# Patient Record
Sex: Male | Born: 1975 | Race: White | Hispanic: No | Marital: Married | Smoking: Never smoker
Health system: Southern US, Community
[De-identification: ages and names within clinical notes are randomized; demographics above are authoritative.]

## PROBLEM LIST (undated history)

## (undated) DIAGNOSIS — D66 Hereditary factor VIII deficiency: Secondary | ICD-10-CM

## (undated) HISTORY — PX: NO PAST SURGERIES: SHX2092

---

## 2009-09-17 ENCOUNTER — Emergency Department: Payer: Self-pay | Admitting: Emergency Medicine

## 2009-09-18 ENCOUNTER — Emergency Department: Payer: Self-pay | Admitting: Emergency Medicine

## 2010-05-31 ENCOUNTER — Emergency Department: Payer: Self-pay | Admitting: Emergency Medicine

## 2010-09-22 ENCOUNTER — Observation Stay (HOSPITAL_COMMUNITY)
Admission: EM | Admit: 2010-09-22 | Discharge: 2010-09-23 | Disposition: A | Discharge status: Home / Self Care | Payer: BC Managed Care – PPO | Source: Ambulatory Visit | Attending: Internal Medicine | Admitting: Internal Medicine

## 2010-09-22 DIAGNOSIS — D66 Hereditary factor VIII deficiency: Secondary | ICD-10-CM | POA: Insufficient documentation

## 2010-09-22 DIAGNOSIS — R55 Syncope and collapse: Principal | ICD-10-CM | POA: Insufficient documentation

## 2010-09-22 DIAGNOSIS — Y9239 Other specified sports and athletic area as the place of occurrence of the external cause: Secondary | ICD-10-CM | POA: Insufficient documentation

## 2010-09-22 DIAGNOSIS — W010XXA Fall on same level from slipping, tripping and stumbling without subsequent striking against object, initial encounter: Secondary | ICD-10-CM | POA: Insufficient documentation

## 2010-09-22 DIAGNOSIS — S0003XA Contusion of scalp, initial encounter: Secondary | ICD-10-CM | POA: Insufficient documentation

## 2010-09-23 ENCOUNTER — Emergency Department (HOSPITAL_COMMUNITY): Payer: BC Managed Care – PPO

## 2010-09-23 LAB — BASIC METABOLIC PANEL
BUN: 11 mg/dL (ref 6–23)
CO2: 25 mEq/L (ref 19–32)
GFR calc non Af Amer: >60 mL/min (ref >60)
Glucose, Bld: 95 mg/dL (ref 70–99)
Potassium: 3.8 mEq/L (ref 3.5–5.1)

## 2010-09-23 LAB — CBC
MCH: 31.1 pg (ref 26.0–34.0)
MCV: 87 fL (ref 78.0–100.0)
Platelets: 162 10*3/uL (ref 150–400)
RBC: 4.24 MIL/uL (ref 4.22–5.81)

## 2010-09-23 LAB — DIFFERENTIAL
Eosinophils Absolute: 0.1 10*3/uL (ref 0.0–0.7)
Eosinophils Relative: 1 % (ref 0–5)
Lymphs Abs: 1.9 10*3/uL (ref 0.7–4.0)
Monocytes Relative: 5 % (ref 3–12)
Neutrophils Relative %: 64 % (ref 43–77)
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
## 2014-05-23 ENCOUNTER — Ambulatory Visit: Payer: Self-pay | Admitting: Family Medicine

## 2017-08-22 ENCOUNTER — Other Ambulatory Visit: Payer: Self-pay

## 2017-08-22 ENCOUNTER — Ambulatory Visit
Admission: EM | Admit: 2017-08-22 | Discharge: 2017-08-22 | Disposition: A | Discharge status: Home / Self Care | Payer: 59 | Attending: Family Medicine | Admitting: Family Medicine

## 2017-08-22 DIAGNOSIS — J0100 Acute maxillary sinusitis, unspecified: Secondary | ICD-10-CM | POA: Diagnosis not present

## 2017-08-22 HISTORY — DX: Hereditary factor VIII deficiency: D66

## 2017-08-22 MED ORDER — AMOXICILLIN-POT CLAVULANATE 875-125 MG PO TABS: 1.0000 | ORAL_TABLET | Freq: Two times a day (BID) | ORAL | 0 refills | Status: AC

## 2017-08-22 NOTE — ED Provider Notes (Signed)
[facility]    CSN: [id number] Arrival date & time: 08/22/17  1350  History   Chief Complaint Chief Complaint  Patient presents with  . Facial Pain    APPT   HPI 42 year old male presents with concerns for sinusitis.  Patient reports a one-week history of nasal congestion, runny nose, sinus pain and pressure.  He reports green nasal discharge.  Associated headache.  No fever.  Has been using Tylenol and NyQuil without improvement.  No known exacerbating factors.  No reported sick contacts.  His pain is mild in severity.  No other associated symptoms.  No other complaints at this time.  Past Medical History:  Diagnosis Date  . Hemophilia [facility](HCC)    Past Surgical History:  Procedure Laterality Date  . NO PAST SURGERIES      Home Medications    Prior to Admission medications   Medication Sig Start Date End Date Taking? Authorizing Provider  amoxicillin-clavulanate (AUGMENTIN) 875-125 MG tablet Take 1 tablet by mouth every 12 (twelve) hours. 08/22/17   Tommie Samsook, Teiana Hajduk G, DO    Family History Family History  Problem Relation Age of Onset  . CVA Father     Social History Social History   Tobacco Use  . Smoking status: Never Smoker  . Smokeless tobacco: Never Used  Substance Use Topics  . Alcohol use: No    Frequency: Never  . Drug use: No     Allergies   Patient has no known allergies.   Review of Systems Review of Systems  Constitutional: Negative for chills and fever.  HENT: Positive for congestion, rhinorrhea, sinus pressure and sinus pain.    Physical Exam Triage Vital Signs ED Triage Vitals  Enc Vitals Group     BP 08/22/17 1417 124/83     Pulse Rate 08/22/17 1417 (!) 52     Resp 08/22/17 1417 18     Temp 08/22/17 1417 98.6 F (37 C)     Temp Source 08/22/17 1417 Oral     SpO2 08/22/17 1417 99 %     Weight 08/22/17 1415 194 lb (88 kg)     Height 08/22/17 1415 5\' 10"  (1.778 m)     Head Circumference --      Peak Flow --      Pain Score  08/22/17 1415 2     Pain Loc --      Pain Edu? --      Excl. in GC? --    Updated Vital Signs BP 124/83 (BP Location: Left Arm)   Pulse (!) 52   Temp 98.6 F (37 C) (Oral)   Resp 18   Ht 5\' 10"  (1.778 m)   Wt 194 lb (88 kg)   SpO2 99%   BMI 27.84 kg/m    Physical Exam  Constitutional: He is oriented to person, place, and time. He appears well-developed. No distress.  HENT:  Head: Normocephalic and atraumatic.  Nose: Nose normal.  Oropharynx clear.  Mild maxillary sinus tenderness palpation.  Eyes: Conjunctivae are normal. Right eye exhibits no discharge. Left eye exhibits no discharge.  Neck: Neck supple.  Cardiovascular: Normal rate and regular rhythm.  Pulmonary/Chest: Effort normal and breath sounds normal.  Lymphadenopathy:    He has no cervical adenopathy.  Neurological: He is alert and oriented to person, place, and time.  Psychiatric: He has a normal mood and affect. His behavior is normal.  Nursing note and vitals reviewed.  UC Treatments / Results  Labs (all labs ordered  are listed, but only abnormal results are displayed) Labs Reviewed - No data to display  EKG  EKG Interpretation None       Radiology No results found.  Procedures Procedures (including critical care time)  Medications Ordered in UC Medications - No data to display   Initial Impression / Assessment and Plan / UC Course  I have reviewed the triage vital signs and the nursing notes.  Pertinent labs & imaging results that were available during my care of the patient were reviewed by me and considered in my medical decision making (see chart for details).    42 year old male presents with sinusitis.  Treating with Augmentin.  Final Clinical Impressions(s) / UC Diagnoses   Final diagnoses:  Acute maxillary sinusitis, recurrence not specified    ED Discharge Orders        Ordered    amoxicillin-clavulanate (AUGMENTIN) 875-125 MG tablet  Every 12 hours     08/22/17 1445      Controlled Substance Prescriptions [place] Controlled Substance Registry consulted? Not Applicable   Tommie Sams, DO 08/22/17 1455

## 2017-08-22 NOTE — Discharge Instructions (Signed)
Antibiotic as prescribed.  Take care  Dr. Claudett Bayly  

## 2017-08-22 NOTE — ED Triage Notes (Signed)
Patient complains of sinus pain and pressure, runny nose, cough x 1 week.

## 2018-06-12 ENCOUNTER — Ambulatory Visit
Admission: EM | Admit: 2018-06-12 | Discharge: 2018-06-12 | Disposition: A | Discharge status: Home / Self Care | Payer: 59 | Attending: Family Medicine | Admitting: Family Medicine

## 2018-06-12 ENCOUNTER — Other Ambulatory Visit: Payer: Self-pay

## 2018-06-12 DIAGNOSIS — J069 Acute upper respiratory infection, unspecified: Secondary | ICD-10-CM | POA: Diagnosis not present

## 2018-06-12 NOTE — Discharge Instructions (Addendum)
Over the counter medication as discussed. Rest. Drink plenty of fluids.  ° °Follow up with your primary care physician this week as needed. Return to Urgent care for new or worsening concerns.  ° °

## 2018-06-12 NOTE — ED Triage Notes (Signed)
Patient complains of sinus pain and pressure, nasal congestion, headache, sneezing that has been constant since Monday.

## 2018-06-12 NOTE — ED Provider Notes (Signed)
[facility] ____________________________________________  Time seen: Approximately 1:47 PM  I have reviewed the triage vital signs and the nursing notes.   HISTORY  Chief Complaint Nasal Congestion   HPI Earl Brock is a 42 y.o. male past medical history of hemophilia, presenting for evaluation of 6 days of runny nose, nasal congestion and some cough.  Reports intermittent sore throat as well.  States intermittent pressure in his sinuses and some fluid sensation to his ear.  States mild sinus discomfort currently.  States he has had sinus infections in the past with constant bad sinus pain and does not feel similar.  Denies known fevers.  Continues to eat and drink well.  Has tried some over-the-counter medication without resolution.  Reports multiple sick contacts at his work.  Denies chest pain, shortness of breath or abdominal pain.  Reports otherwise doing well.  Jenell Milliner, MD: PCP    Past Medical History:  Diagnosis Date  . Hemophilia (HCC)     There are no active problems to display for this patient.   Past Surgical History:  Procedure Laterality Date  . NO PAST SURGERIES       No current facility-administered medications for this encounter.   Current Outpatient Medications:  .  albuterol (PROVENTIL HFA;VENTOLIN HFA) 108 (90 Base) MCG/ACT inhaler, Inhale into the lungs every 6 (six) hours as needed for wheezing or shortness of breath., Disp: , Rfl:  .  fluticasone (FLOVENT HFA) 110 MCG/ACT inhaler, Inhale into the lungs 2 (two) times daily., Disp: , Rfl:  .  amoxicillin-clavulanate (AUGMENTIN) 875-125 MG tablet, Take 1 tablet by mouth every 12 (twelve) hours., Disp: 14 tablet, Rfl: 0  Allergies Patient has no known allergies.  Family History  Problem Relation Age of Onset  . CVA Father     Social History Social History   Tobacco Use  . Smoking status: Never Smoker  . Smokeless tobacco: Never Used  Substance Use Topics  . Alcohol  use: No    Frequency: Never  . Drug use: No    Review of Systems Constitutional: No fever ENT: As above Cardiovascular: Denies chest pain. Respiratory: Denies shortness of breath. Gastrointestinal: No abdominal pain.   Musculoskeletal: Negative for back pain. Skin: Negative for rash.   ____________________________________________   PHYSICAL EXAM:  VITAL SIGNS: ED Triage Vitals  Enc Vitals Group     BP 06/12/18 1300 123/81     Pulse Rate 06/12/18 1300 76     Resp 06/12/18 1300 18     Temp 06/12/18 1300 98.2 F (36.8 C)     Temp Source 06/12/18 1300 Oral     SpO2 06/12/18 1300 100 %     Weight 06/12/18 1259 190 lb (86.2 kg)     Height 06/12/18 1259 5\' 10"  (1.778 m)     Head Circumference --      Peak Flow --      Pain Score 06/12/18 1259 3     Pain Loc --      Pain Edu? --      Excl. in GC? --     Constitutional: Alert and oriented. Well appearing and in no acute distress. Eyes: Conjunctivae are normal.  Head: Atraumatic. No sinus tenderness to palpation. No swelling. No erythema.  Ears: no erythema, normal TMs bilaterally.   Nose:Nasal congestion with clear rhinorrhea  Mouth/Throat: Mucous membranes are moist. No pharyngeal erythema. No tonsillar swelling or exudate.  Neck: No stridor.  No cervical spine tenderness to palpation. Hematological/Lymphatic/Immunilogical:  No cervical lymphadenopathy. Cardiovascular: Normal rate, regular rhythm. Grossly normal heart sounds.  Good peripheral circulation. Respiratory: Normal respiratory effort.  No retractions. No wheezes, rales or rhonchi. Good air movement.  Musculoskeletal: Ambulatory with steady gait.  Neurologic:  Normal speech and language. No gait instability. Skin:  Skin appears warm, dry and intact. No rash noted. Psychiatric: Mood and affect are normal. Speech and behavior are normal.   ___________________________________________   LABS (all labs ordered are listed, but only abnormal results are  displayed)  Labs Reviewed - No data to display ____________________________________________  PROCEDURES Procedures   INITIAL IMPRESSION / ASSESSMENT AND PLAN / ED COURSE  Pertinent labs & imaging results that were available during my care of the patient were reviewed by me and considered in my medical decision making (see chart for details).  Well-appearing patient.  No acute distress.  Suspect viral upper respiratory infection.  Patient declined need for lidocaine gargle or prescription cough medication.  Discussed over-the-counter medication use.  Encourage rest, fluids, supportive care.  Discussed follow up with Primary care physician this week as needed. Discussed follow up and return parameters including no resolution or any worsening concerns. Patient verbalized understanding and agreed to plan.   ____________________________________________   FINAL CLINICAL IMPRESSION(S) / ED DIAGNOSES  Final diagnoses:  Upper respiratory tract infection, unspecified type     ED Discharge Orders    None       Note: This dictation was prepared with Dragon dictation along with smaller phrase technology. Any transcriptional errors that result from this process are unintentional.         Renford DillsMiller, Tionne Dayhoff, NP 06/12/18 1350
